# Patient Record
Sex: Female | Born: 1960 | Hispanic: No | Smoking: Current every day smoker
Health system: Southern US, Community
[De-identification: ages and names within clinical notes are randomized; demographics above are authoritative.]

## PROBLEM LIST (undated history)

## (undated) DIAGNOSIS — K219 Gastro-esophageal reflux disease without esophagitis: Secondary | ICD-10-CM

## (undated) HISTORY — DX: Gastro-esophageal reflux disease without esophagitis: K21.9

## (undated) HISTORY — PX: ADENOIDECTOMY: SUR15

## (undated) HISTORY — PX: TUBAL LIGATION: SHX77

## (undated) HISTORY — PX: TONSILLECTOMY: SUR1361

---

## 2004-12-10 ENCOUNTER — Emergency Department (HOSPITAL_COMMUNITY): Admission: EM | Admit: 2004-12-10 | Discharge: 2004-12-10 | Payer: Self-pay | Admitting: Emergency Medicine

## 2009-03-14 ENCOUNTER — Emergency Department (HOSPITAL_COMMUNITY): Admission: EM | Admit: 2009-03-14 | Discharge: 2009-03-14 | Payer: Self-pay | Admitting: Emergency Medicine

## 2010-08-16 ENCOUNTER — Encounter: Payer: Self-pay | Admitting: Family Medicine

## 2010-08-16 ENCOUNTER — Encounter: Payer: Self-pay | Admitting: Emergency Medicine

## 2010-08-22 ENCOUNTER — Emergency Department (HOSPITAL_COMMUNITY)
Admission: EM | Admit: 2010-08-22 | Discharge: 2010-08-22 | Payer: Self-pay | Source: Home / Self Care | Admitting: Emergency Medicine

## 2010-08-25 LAB — WOUND CULTURE

## 2018-02-10 ENCOUNTER — Other Ambulatory Visit: Payer: Self-pay | Admitting: Internal Medicine

## 2018-02-10 DIAGNOSIS — R1013 Epigastric pain: Secondary | ICD-10-CM

## 2018-02-16 ENCOUNTER — Ambulatory Visit (HOSPITAL_COMMUNITY)
Admission: RE | Admit: 2018-02-16 | Discharge: 2018-02-16 | Disposition: A | Discharge status: Home / Self Care | Payer: BLUE CROSS/BLUE SHIELD | Source: Ambulatory Visit | Attending: Internal Medicine | Admitting: Internal Medicine

## 2018-02-16 DIAGNOSIS — K8020 Calculus of gallbladder without cholecystitis without obstruction: Secondary | ICD-10-CM | POA: Insufficient documentation

## 2018-02-16 DIAGNOSIS — R1013 Epigastric pain: Secondary | ICD-10-CM | POA: Insufficient documentation

## 2018-02-16 DIAGNOSIS — K7689 Other specified diseases of liver: Secondary | ICD-10-CM | POA: Insufficient documentation

## 2018-02-23 ENCOUNTER — Ambulatory Visit (INDEPENDENT_AMBULATORY_CARE_PROVIDER_SITE_OTHER): Payer: BLUE CROSS/BLUE SHIELD | Admitting: General Surgery

## 2018-02-23 ENCOUNTER — Encounter: Payer: Self-pay | Admitting: General Surgery

## 2018-02-23 VITALS — BP 151/89 | HR 85 | Temp 98.0°F | Resp 20 | Wt 161.0 lb

## 2018-02-23 DIAGNOSIS — K8010 Calculus of gallbladder with chronic cholecystitis without obstruction: Secondary | ICD-10-CM

## 2018-02-23 DIAGNOSIS — K219 Gastro-esophageal reflux disease without esophagitis: Secondary | ICD-10-CM | POA: Diagnosis not present

## 2018-02-23 MED ORDER — SUCRALFATE 1 GM/10ML PO SUSP: 1.0000 g | Freq: Three times a day (TID) | ORAL | 0 refills | Status: DC

## 2018-02-23 MED ORDER — OMEPRAZOLE MAGNESIUM 20 MG PO TBEC: 40.0000 mg | DELAYED_RELEASE_TABLET | Freq: Two times a day (BID) | ORAL | 1 refills | Status: DC

## 2018-02-23 NOTE — Patient Instructions (Addendum)
Take omeprazole daily to twice daily.  Carafate Rx Four times a day to help with gastritis if needed.    Gastritis, Adult Gastritis is swelling (inflammation) of the stomach. When you have this condition, you can have these problems (symptoms):  Pain in your stomach.  A burning feeling in your stomach.  Feeling sick to your stomach (nauseous).  Throwing up (vomiting).  Feeling too full after you eat.  It is important to get help for this condition. Without help, your stomach can bleed, and you can get sores (ulcers) in your stomach. Follow these instructions at home:  Take over-the-counter and prescription medicines only as told by your doctor.  If you were prescribed an antibiotic medicine, take it as told by your doctor. Do not stop taking it even if you start to feel better.  Drink enough fluid to keep your pee (urine) clear or pale yellow.  Instead of eating big meals, eat small meals often. Contact a health care provider if:  Your problems get worse.  Your problems go away and then come back. Get help right away if:  You throw up blood or something that looks like coffee grounds.  You have black or dark red poop (stools).  You cannot keep fluids down.  Your stomach pain gets worse.  You have a fever.  You do not feel better after 1 week. This information is not intended to replace advice given to you by your health care provider. Make sure you discuss any questions you have with your health care provider. Document Released: 12/29/2007 Document Revised: 03/10/2016 Document Reviewed: 04/05/2015 Elsevier Interactive Patient Education  2018 [employer].   Cholelithiasis Cholelithiasis is also called "gallstones." It is a kind of gallbladder disease. The gallbladder is an organ that stores a liquid (bile) that helps you digest fat. Gallstones may not cause symptoms (may be silent gallstones) until they cause a blockage, and then they can cause pain (gallbladder  attack). Follow these instructions at home:  Take over-the-counter and prescription medicines only as told by your doctor.  Stay at a healthy weight.  Eat healthy foods. This includes: ? Eating fewer fatty foods, like fried foods. ? Eating fewer refined carbs (refined carbohydrates). Refined carbs are breads and grains that are highly processed, like white bread and white rice. Instead, choose whole grains like whole-wheat bread and brown rice. ? Eating more fiber. Almonds, fresh fruit, and beans are healthy sources of fiber.  Keep all follow-up visits as told by your doctor. This is important. Contact a doctor if:  You have sudden pain in the upper right side of your belly (abdomen). Pain might spread to your right shoulder or your chest. This may be a sign of a gallbladder attack.  You feel sick to your stomach (are nauseous).  You throw up (vomit).  You have been diagnosed with gallstones that have no symptoms and you get: ? Belly pain. ? Discomfort, burning, or fullness in the upper part of your belly (indigestion). Get help right away if:  You have sudden pain in the upper right side of your belly, and it lasts for more than 2 hours.  You have belly pain that lasts for more than 5 hours.  You have a fever or chills.  You keep feeling sick to your stomach or you keep throwing up.  Your skin or the whites of your eyes turn yellow (jaundice).  You have dark-colored pee (urine).  You have light-colored poop (stool). Summary  Cholelithiasis is also  called "gallstones."  The gallbladder is an organ that stores a liquid (bile) that helps you digest fat.  Silent gallstones are gallstones that do not cause symptoms.  A gallbladder attack may cause sudden pain in the upper right side of your belly. Pain might spread to your right shoulder or your chest. If this happens, contact your doctor.  If you have sudden pain in the upper right side of your belly that lasts for more  than 2 hours, get help right away. This information is not intended to replace advice given to you by your health care provider. Make sure you discuss any questions you have with your health care provider. Document Released: 12/29/2007 Document Revised: 03/28/2016 Document Reviewed: 03/28/2016 Elsevier Interactive Patient Education  2017 [employer]   Laparoscopic Cholecystectomy Laparoscopic cholecystectomy is surgery to remove the gallbladder. The gallbladder is a pear-shaped organ that lies beneath the liver on the right side of the body. The gallbladder stores bile, which is a fluid that helps the body to digest fats. Cholecystectomy is often done for inflammation of the gallbladder (cholecystitis). This condition is usually caused by a buildup of gallstones (cholelithiasis) in the gallbladder. Gallstones can block the flow of bile, which can result in inflammation and pain. In severe cases, emergency surgery may be required. This procedure is done though small incisions in your abdomen (laparoscopic surgery). A thin scope with a camera (laparoscope) is inserted through one incision. Thin surgical instruments are inserted through the other incisions. In some cases, a laparoscopic procedure may be turned into a type of surgery that is done through a larger incision (open surgery). Tell a health care provider about:  Any allergies you have.  All medicines you are taking, including vitamins, herbs, eye drops, creams, and over-the-counter medicines.  Any problems you or family members have had with anesthetic medicines.  Any blood disorders you have.  Any surgeries you have had.  Any medical conditions you have.  Whether you are pregnant or may be pregnant. What are the risks? Generally, this is a safe procedure. However, problems may occur, including:  Infection.  Bleeding.  Allergic reactions to medicines.  Damage to other structures or organs.  A stone remaining in the common  bile duct. The common bile duct carries bile from the gallbladder into the small intestine.  A bile leak from the cyst duct that is clipped when your gallbladder is removed.  Medicines  Ask your health care provider about: ? Changing or stopping your regular medicines. This is especially important if you are taking diabetes medicines or blood thinners. ? Taking medicines such as aspirin and ibuprofen. These medicines can thin your blood. Do not take these medicines before your procedure if your health care provider instructs you not to.  You may be given antibiotic medicine to help prevent infection. General instructions  Let your health care provider know if you develop a cold or an infection before surgery.  Plan to have someone take you home from the hospital or clinic.  Ask your health care provider how your surgical site will be marked or identified. What happens during the procedure?  To reduce your risk of infection: ? Your health care team will wash or sanitize their hands. ? Your skin will be washed with soap. ? Hair may be removed from the surgical area.  An IV tube may be inserted into one of your veins.  You will be given one or more of the following: ? A medicine to help  you relax (sedative). ? A medicine to make you fall asleep (general anesthetic).  A breathing tube will be placed in your mouth.  Your surgeon will make several small cuts (incisions) in your abdomen.  The laparoscope will be inserted through one of the small incisions. The camera on the laparoscope will send images to a TV screen (monitor) in the operating room. This lets your surgeon see inside your abdomen.  Air-like gas will be pumped into your abdomen. This will expand your abdomen to give the surgeon more room to perform the surgery.  Other tools that are needed for the procedure will be inserted through the other incisions. The gallbladder will be removed through one of the  incisions.  Your common bile duct may be examined. If stones are found in the common bile duct, they may be removed.  After your gallbladder has been removed, the incisions will be closed with stitches (sutures), staples, or skin glue.  Your incisions may be covered with a bandage (dressing). The procedure may vary among health care providers and hospitals. What happens after the procedure?  Your blood pressure, heart rate, breathing rate, and blood oxygen level will be monitored until the medicines you were given have worn off.  You will be given medicines as needed to control your pain.  Do not drive for 24 hours if you were given a sedative. This information is not intended to replace advice given to you by your health care provider. Make sure you discuss any questions you have with your health care provider. Document Released: 07/12/2005 Document Revised: 02/01/2016 Document Reviewed: 12/29/2015 Elsevier Interactive Patient Education  2018 [employer].

## 2018-02-23 NOTE — Progress Notes (Signed)
[facility] History and Physical  Reason for Referral: Abdominal pain? Gallbladder  Referring Physician: Dr. Margo Aye   Chief Complaint    Cholelithiasis      Alejandra Jensen is a 58 y.o. female.  HPI: Alejandra Jensen is a 57 yo who reports having epigastric pain for about 6 months now. She says that this pain can occur any time and does not have association with food. She says she can have pain with an empty stomach or after eating or not related to eating at all. She has had some nausea/bloating but no vomiting. She reports no RUQ pain at this time. She says that she has been prescribed Nexium in the past that helped some with the pain and then was on Omeprazole and that this helped the most. She has never been in BID omeprazole and has never had GI/ EGD evaluation or tried Carafate. She denies any excessive BC/ Aleve/ Ibuprofen use but has had to use Excedrin in the past for toothaches/ headaches. She has not been on the Excedrin for several weeks.    Past Medical History:  Diagnosis Date  . GERD (gastroesophageal reflux disease)     Past Surgical History:  Procedure Laterality Date  . ADENOIDECTOMY    . TONSILLECTOMY    . TUBAL LIGATION      Family History  Problem Relation Age of Onset  . Asthma Mother     Social History   Tobacco Use  . Smoking status: Current Every Day Smoker    Types: Cigarettes  . Smokeless tobacco: Never Used  Substance Use Topics  . Alcohol use: Not Currently  . Drug use: Never    Medications: I have reviewed the patient's current medications. Allergies as of 02/23/2018   No Known Allergies     Medication List        Accurate as of 02/23/18  4:00 PM. Always use your most recent med list.          omeprazole 20 MG tablet Commonly known as:  PRILOSEC OTC Take 2 tablets (40 mg total) by mouth 2 (two) times daily.   sucralfate 1 GM/10ML suspension Commonly known as:  CARAFATE Take 10 mLs (1 g total) by mouth 4 (four) times daily  -  with meals and at bedtime.        ROS:  A comprehensive review of systems was negative except for: Gastrointestinal: positive for abdominal pain and reflux symptoms  Blood pressure (!) 151/89, pulse 85, temperature 98 F (36.7 C), temperature source Temporal, resp. rate 20, weight 161 lb (73 kg). Physical Exam  Constitutional: She is oriented to person, place, and time. She appears well-developed and well-nourished.  HENT:  Head: Normocephalic.  Eyes: Pupils are equal, round, and reactive to light.  Neck: Normal range of motion.  Cardiovascular: Normal rate and regular rhythm.  Pulmonary/Chest: Effort normal and breath sounds normal.  Abdominal: Soft. She exhibits no distension. There is no tenderness.  Musculoskeletal: Normal range of motion. She exhibits no edema.  Neurological: She is alert and oriented to person, place, and time.  Skin: Skin is warm and dry.  Psychiatric: She has a normal mood and affect. Her behavior is normal. Judgment and thought content normal.  Vitals reviewed.   Results: [place] RUQ - Study Result   CLINICAL DATA:  Epigastric pain.  EXAM: ABDOMEN ULTRASOUND COMPLETE  COMPARISON:  No prior.  FINDINGS: Gallbladder: Gallstones and sludge noted. Gallbladder wall thickness 3.9 mm. Cholecystitis cannot be excluded. Negative Eulah Pont  sign.  Common bile duct: Diameter: 1.6 mm  Liver: Mild increased echogenicity consistent fatty infiltration and/or hepatocellular disease. 2.6 cm cyst with thin septations noted the left lobe of the liver. This is most likely benign. Portal vein is patent on color Doppler imaging with normal direction of blood flow towards the liver.  IVC: No abnormality visualized.  Pancreas: Visualized portion unremarkable.  Spleen: Size and appearance within normal limits.  Right Kidney: Length: 10.9 cm. Echogenicity within normal limits. No mass or hydronephrosis visualized.  Left Kidney: Length: 11.3 cm.  Echogenicity within normal limits. No mass or hydronephrosis visualized.  Abdominal aorta: No aneurysm visualized.  Other findings: None.  IMPRESSION: 1. Gallbladder stones and gallstones noted. Thickening of the gallbladder wall. Cholecystitis could present this fashion. No biliary distention. 2. Mild increased echogenicity liver consistent with fatty infiltration and/or hepatocellular disease. 2.6 cm benign-appearing cyst left lobe of the liver.    Assessment & Plan:  Alejandra Jensen is a 57 y.o. female with gallstones and chronic cholecystitis on her US but has complaints of epigastric pain that is made better when she takes her PPI. She says she had been off the omeprazole for several weeks and recently started this back with improvement in the pain. She reports that she really does not want surgery unless it is necessary, which is completely understandable and reasonable. Since her pain is improving with the PPI, I have discussed with her the possibility of gastritis/ ulcer disease.  -Recommend PPI be taken everyday and up to twice daily and have written Rx for carafate to see if this helps her pain improve  -If she is not continuing to improve in the next 2 weeks, she is going to call and let us know.  -We discussed that the symptoms could be from gastritis or the gallbladder or a combination of both and that she does have positive findings on her US that would be an indication for removing the gallbladder  -Discussed that if the PPI/ Carafate improve her symptoms, she likely needs referral to GI for EGD in the future  -Will send note to Dr. Margo AyeHall -Follow up PRN   Discussed laparoscopic cholecystectomy and the risk involved pending her deciding to pursue surgery.  I counseled the patient about the indication, risks and benefits of laparoscopic cholecystectomy.  She understands there is a very small chance for bleeding, infection, injury to normal structures (including common bile  duct), conversion to open surgery, persistent symptoms, evolution of postcholecystectomy diarrhea, need for secondary interventions, anesthesia reaction, cardiopulmonary issues and other risks not specifically detailed here. I described the expected recovery, the plan for follow-up and the restrictions during the recovery phase.   All questions were answered to the satisfaction of the patient.   Lucretia RoersLindsay C Even Budlong 02/23/2018, 4:00 PM

## 2019-03-07 ENCOUNTER — Ambulatory Visit
Admission: EM | Admit: 2019-03-07 | Discharge: 2019-03-07 | Disposition: A | Discharge status: Home / Self Care | Payer: BC Managed Care – PPO | Attending: Emergency Medicine | Admitting: Emergency Medicine

## 2019-03-07 ENCOUNTER — Other Ambulatory Visit: Payer: Self-pay

## 2019-03-07 DIAGNOSIS — G8929 Other chronic pain: Secondary | ICD-10-CM | POA: Insufficient documentation

## 2019-03-07 DIAGNOSIS — N3001 Acute cystitis with hematuria: Secondary | ICD-10-CM | POA: Diagnosis present

## 2019-03-07 DIAGNOSIS — M25571 Pain in right ankle and joints of right foot: Secondary | ICD-10-CM | POA: Diagnosis present

## 2019-03-07 DIAGNOSIS — L245 Irritant contact dermatitis due to other chemical products: Secondary | ICD-10-CM | POA: Diagnosis present

## 2019-03-07 DIAGNOSIS — M722 Plantar fascial fibromatosis: Secondary | ICD-10-CM | POA: Insufficient documentation

## 2019-03-07 DIAGNOSIS — R319 Hematuria, unspecified: Secondary | ICD-10-CM | POA: Insufficient documentation

## 2019-03-07 LAB — POCT URINALYSIS DIP (MANUAL ENTRY)
Bilirubin, UA: NEGATIVE
Glucose, UA: NEGATIVE mg/dL
Ketones, POC UA: NEGATIVE mg/dL
Nitrite, UA: NEGATIVE
Protein Ur, POC: NEGATIVE mg/dL
Spec Grav, UA: 1.015 (ref 1.010–1.025)
Urobilinogen, UA: 0.2 E.U./dL
pH, UA: 7 (ref 5.0–8.0)

## 2019-03-07 MED ORDER — NAPROXEN 375 MG PO TABS: 375.0000 mg | ORAL_TABLET | Freq: Two times a day (BID) | ORAL | 0 refills | Status: AC

## 2019-03-07 MED ORDER — NITROFURANTOIN MONOHYD MACRO 100 MG PO CAPS: 100.0000 mg | ORAL_CAPSULE | Freq: Two times a day (BID) | ORAL | 0 refills | Status: AC

## 2019-03-07 MED ORDER — TRIAMCINOLONE ACETONIDE 0.025 % EX CREA: 1.0000 "application " | TOPICAL_CREAM | Freq: Two times a day (BID) | CUTANEOUS | 0 refills | Status: AC

## 2019-03-07 NOTE — Discharge Instructions (Signed)
UTI: Urine culture sent.  We will call you with abnormal results.   Push fluids and get plenty of rest.   Take antibiotic as directed and to completion Follow up with PCP in 1-2 weeks to recheck urine Return here or go to ER if you have any new or worsening symptoms such as fever, worsening abdominal pain, nausea/vomiting, flank pain, etc...  Ankle pain/ plantar fascitis: Continue conservative management of rest, ice, elevation, and gentle stretches.  You may freeze a water bottle and roll under foot to assist with stretching Use an OTC ankle brace to help with support and to immobilize the joint Take naproxen as needed for pain relief (may cause abdominal discomfort, ulcers, and GI bleeds avoid taking with other NSAIDs) Follow up with orthopedist for further evaluation and management Present to ER if worsening or new symptoms (fever, chills, chest pain, abdominal pain, changes in bowel or bladder habits, pain radiating into lower legs, etc...)   Contact dermatitis: Wash with warm water and mild soap Avoid hot showers or baths Moisturize skin daily Triamcinolone cream prescribed.  Use as directed Use OTC zyrtec, claritin, or allegra during the day as needed for itching Follow up with PCP in 1-2 weeks for recheck Return or go to the ED if you have any new or worsen symptoms such as fever, chills, nausea, vomiting, redness, swelling, discharge, oral lesions, shortness of breath, chest pain, abdominal pain, changes in bowel or bladder function, etc..Marland Kitchen

## 2019-03-07 NOTE — ED Triage Notes (Signed)
Pt report urinary frequency and noticed with pink tinged urine and also right foot pain that has been chronic

## 2019-03-07 NOTE — ED Provider Notes (Addendum)
[facility]   CC: Multiple complaints  SUBJECTIVE:  Alejandra Jensen is a 58 y.o. female who complains of urinary frequency, urgency that began this morning, symptoms now resolved.  Admits to delayed bathroom breaks.  Denies abdominal or flank pain.  Has NOT tried OTC medication.  Symptoms are made worse with urination.  Admits to similar symptoms in the past related to UTI.  Does admit to seeing "pink" on the tissue with wiping.  Denies fever, chills, chest pain, SOB, nausea, vomiting, abdominal pain, flank pain, abnormal vaginal discharge or bleeding.    Patient also complaints of RT ankle/ foot pain x 6 months.  Denies a precipitating event or trauma.  Does work at [employer] and walks on concrete floors during her shifts. Localizes pain to outside of foot and heel.  Describes as worsening, intermittent and like "needles."  Has tried icing and heating with minimal relief.  Worse with walking.  Complains of possible swelling.  Denies associated erythema, ecchymosis, numbness or tingling.    Also mentions rash to right thumb x 2 months.  States she use to place stickers on her thumb, but has stopped.  Describes as intermittent and itching.  Has tried OTC creams without relief.  Denies wounds, or discharge.    Admits to smoking 1 PPD x 20-30 years  LMP: No LMP recorded. Patient is postmenopausal.  ROS: As in HPI.  All other pertinent ROS negative.     Past Medical History:  Diagnosis Date  . GERD (gastroesophageal reflux disease)    Past Surgical History:  Procedure Laterality Date  . ADENOIDECTOMY    . TONSILLECTOMY    . TUBAL LIGATION     No Known Allergies No current facility-administered medications on file prior to encounter.    Current Outpatient Medications on File Prior to Encounter  Medication Sig Dispense Refill  . [DISCONTINUED] omeprazole (PRILOSEC OTC) 20 MG tablet Take 2 tablets (40 mg total) by mouth 2 (two) times daily. 60 tablet 1  . [DISCONTINUED]  sucralfate (CARAFATE) 1 GM/10ML suspension Take 10 mLs (1 g total) by mouth 4 (four) times daily -  with meals and at bedtime. 420 mL 0   Social History   Socioeconomic History  . Marital status: Legally Separated    Spouse name: Not on file  . Number of children: Not on file  . Years of education: Not on file  . Highest education level: Not on file  Occupational History  . Not on file  Social Needs  . Financial resource strain: Not on file  . Food insecurity    Worry: Not on file    Inability: Not on file  . Transportation needs    Medical: Not on file    Non-medical: Not on file  Tobacco Use  . Smoking status: Current Every Day Smoker    Types: Cigarettes  . Smokeless tobacco: Never Used  Substance and Sexual Activity  . Alcohol use: Not Currently  . Drug use: Never  . Sexual activity: Not on file  Lifestyle  . Physical activity    Days per week: Not on file    Minutes per session: Not on file  . Stress: Not on file  Relationships  . Social Herbalist on phone: Not on file    Gets together: Not on file    Attends religious service: Not on file    Active member of club or organization: Not on file    Attends meetings of clubs  or organizations: Not on file    Relationship status: Not on file  . Intimate partner violence    Fear of current or ex partner: Not on file    Emotionally abused: Not on file    Physically abused: Not on file    Forced sexual activity: Not on file  Other Topics Concern  . Not on file  Social History Narrative  . Not on file   Family History  Problem Relation Age of Onset  . Asthma Mother     OBJECTIVE:  Vitals:   03/07/19 1456  BP: (!) 146/80  Pulse: 90  Resp: 18  Temp: 98.4 F (36.9 C)  SpO2: 96%   General appearance: AOx3 in no acute distress HEENT: NCAT.  PERRL Lungs: clear to auscultation bilaterally without adventitious breath sounds Heart: regular rate and rhythm.  Radial pulses 2+ symmetrical bilaterally  Abdomen: soft; non-distended; no tenderness; bowel sounds present; no guarding Back: no CVA tenderness Extremities: no edema; symmetrical with no gross deformities; RT ankle/ Foot: skin without erythema swelling or ecchymosis, TTP over lateral ankle and heel, 5/5 with dorsi/plantar flexion, dorsalis pedis pulse 2+, sensation intact Skin: warm and dry; thickened white scaly skin changes to posterior aspect of thumb, localized to proximal aspect of 1st phalanx, NTTP, no obvious drainage or bleeding Neurologic: Ambulates from chair to exam table without difficulty Psychological: alert and cooperative; normal mood and affect  Labs Reviewed  POCT URINALYSIS DIP (MANUAL ENTRY) - Abnormal; Notable for the following components:      Result Value   Blood, UA moderate (*)    Leukocytes, UA Trace (*)    All other components within normal limits  URINE CULTURE    ASSESSMENT & PLAN:  1. Hematuria, unspecified type   2. Acute cystitis with hematuria   3. Chronic pain of right ankle   4. Plantar fasciitis of right foot   5. Irritant contact dermatitis due to other chemical products     Meds ordered this encounter  Medications  . nitrofurantoin, macrocrystal-monohydrate, (MACROBID) 100 MG capsule    Sig: Take 1 capsule (100 mg total) by mouth 2 (two) times daily for 5 days.    Dispense:  10 capsule    Refill:  0    Order Specific Question:   Supervising Provider    Answer:   Eustace MooreNELSON, YVONNE SUE [[id number]]  . naproxen (NAPROSYN) 375 MG tablet    Sig: Take 1 tablet (375 mg total) by mouth 2 (two) times daily.    Dispense:  20 tablet    Refill:  0    Order Specific Question:   Supervising Provider    Answer:   Eustace MooreNELSON, YVONNE SUE [[id number]]  . triamcinolone (KENALOG) 0.025 % cream    Sig: Apply 1 application topically 2 (two) times daily.    Dispense:  30 g    Refill:  0    Order Specific Question:   Supervising Provider    Answer:   Eustace MooreELSON, YVONNE SUE [[id number]]   UTI: Urine culture sent.   We will call you with abnormal results.   Push fluids and get plenty of rest.   Take antibiotic as directed and to completion Follow up with PCP in 1-2 weeks to recheck urine Return here or go to ER if you have any new or worsening symptoms such as fever, worsening abdominal pain, nausea/vomiting, flank pain, etc...  Ankle pain/ plantar fascitis: Continue conservative management of rest, ice, elevation, and gentle stretches.  You may freeze a  water bottle and roll under foot to assist with stretching Use an OTC ankle brace to help with support and to immobilize the joint Take naproxen as needed for pain relief (may cause abdominal discomfort, ulcers, and GI bleeds avoid taking with other NSAIDs) Follow up with orthopedist for further evaluation and management Present to ER if worsening or new symptoms (fever, chills, chest pain, abdominal pain, changes in bowel or bladder habits, pain radiating into lower legs, etc...)   Contact dermatitis: Wash with warm water and mild soap Avoid hot showers or baths Moisturize skin daily Triamcinolone cream prescribed.  Use as directed Use OTC zyrtec, claritin, or allegra during the day as needed for itching Follow up with PCP in 1-2 weeks for recheck Return or go to the ED if you have any new or worsen symptoms such as fever, chills, nausea, vomiting, redness, swelling, discharge, oral lesions, shortness of breath, chest pain, abdominal pain, changes in bowel or bladder function, etc...   Outlined signs and symptoms indicating need for more acute intervention. Patient verbalized understanding. After Visit Summary given.     Rennis HardingWurst, Brexlee Heberlein, PA-C 03/07/19 1547    Alvino ChapelWurst, Pakala VillageBrittany, PA-C 03/07/19 1549

## 2019-03-08 ENCOUNTER — Telehealth: Payer: Self-pay | Admitting: Emergency Medicine

## 2019-03-08 LAB — URINE CULTURE

## 2019-03-08 MED ORDER — CEPHALEXIN 500 MG PO CAPS: 500.0000 mg | ORAL_CAPSULE | Freq: Two times a day (BID) | ORAL | 0 refills | Status: AC

## 2019-03-08 NOTE — Telephone Encounter (Signed)
Reports nausea and vomiting to macrobid, patient requests another antibiotic. Keflex sent

## 2019-06-11 IMAGING — US US ABDOMEN COMPLETE
1 series · 13 of 25 positions shown · non-contrast
Comparison: No prior.

CLINICAL DATA: Epigastric pain.

EXAM:
ABDOMEN ULTRASOUND COMPLETE

[Series 1: us abdomen complete · 0.15mm/px · 13 of 132 slices shown]
[im 1/132]
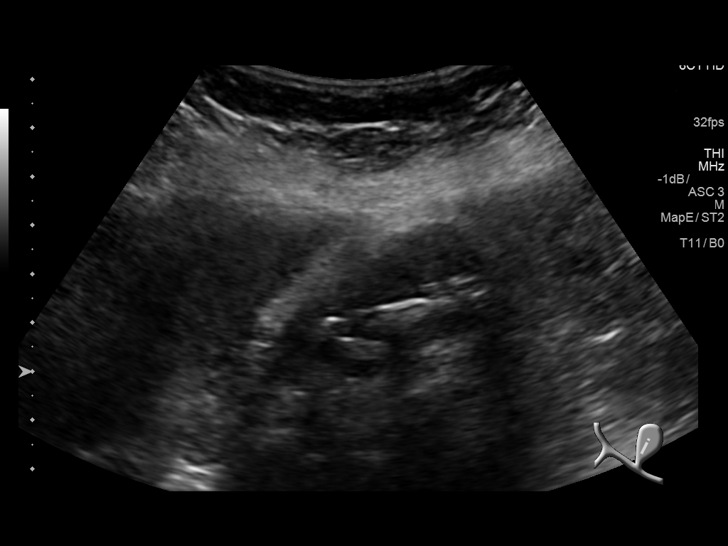
[im 11/132]
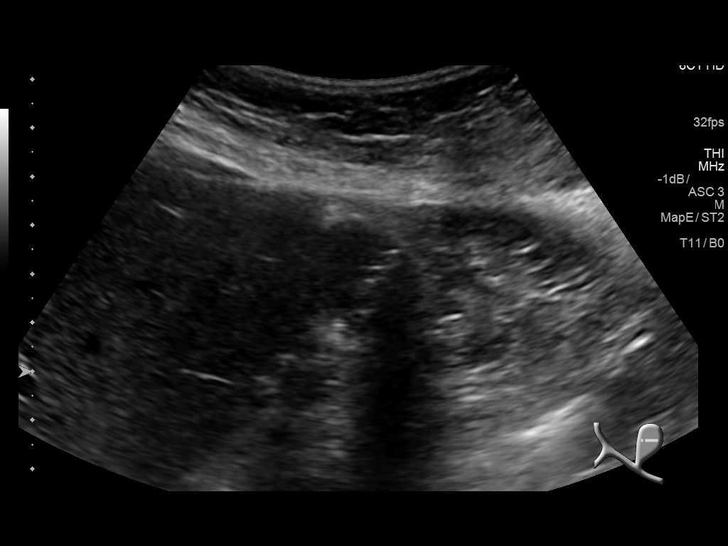
[im 22/132]
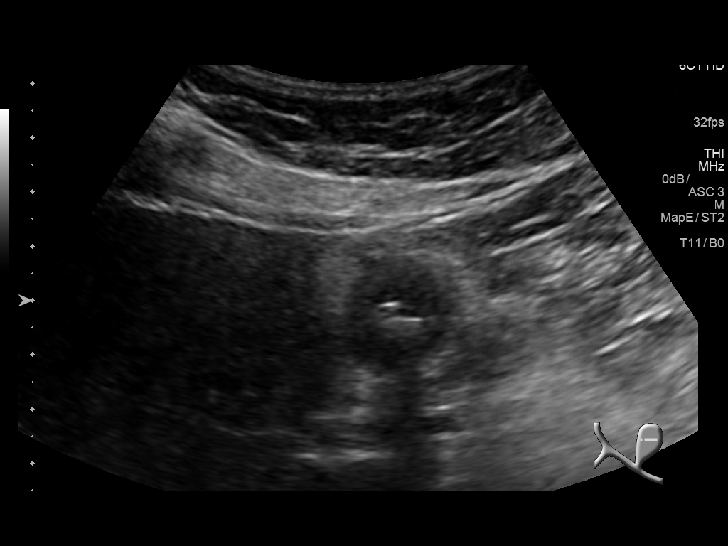
[im 33/132]
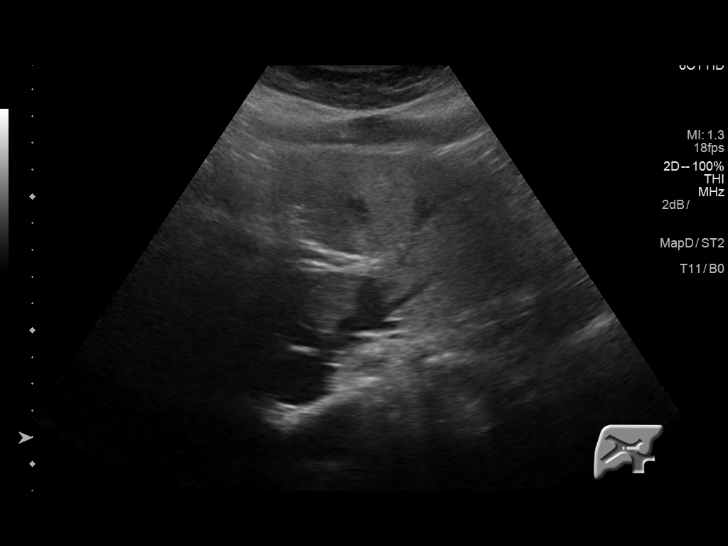
[im 44/132]
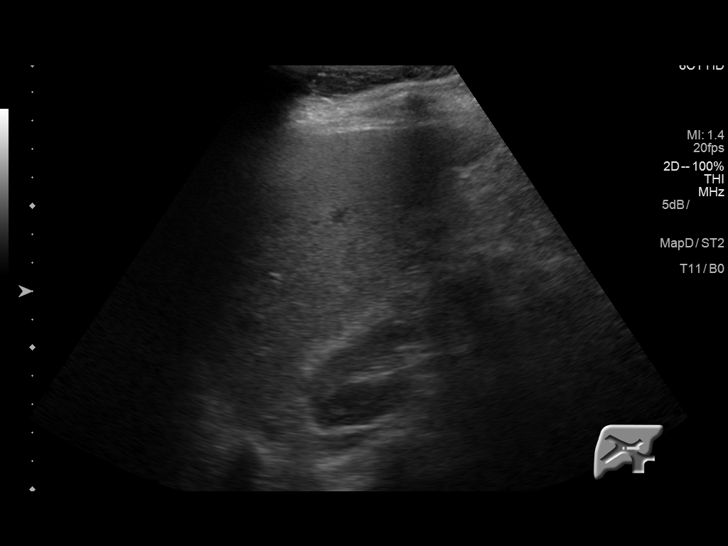
[im 55/132]
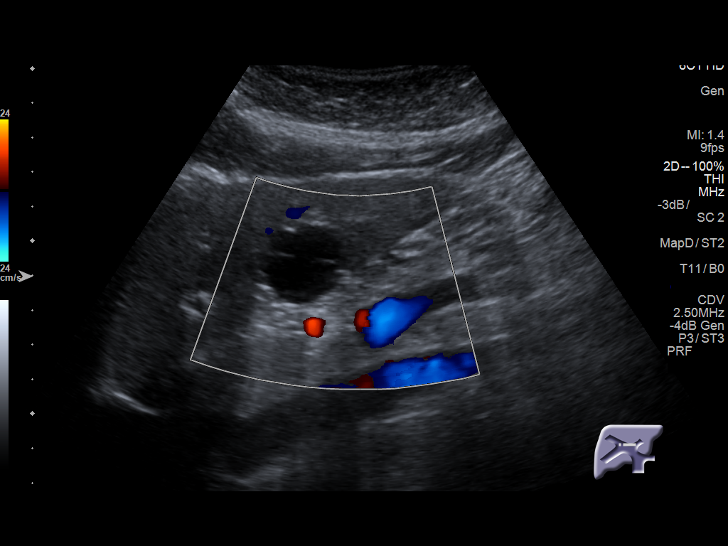
[im 66/132]
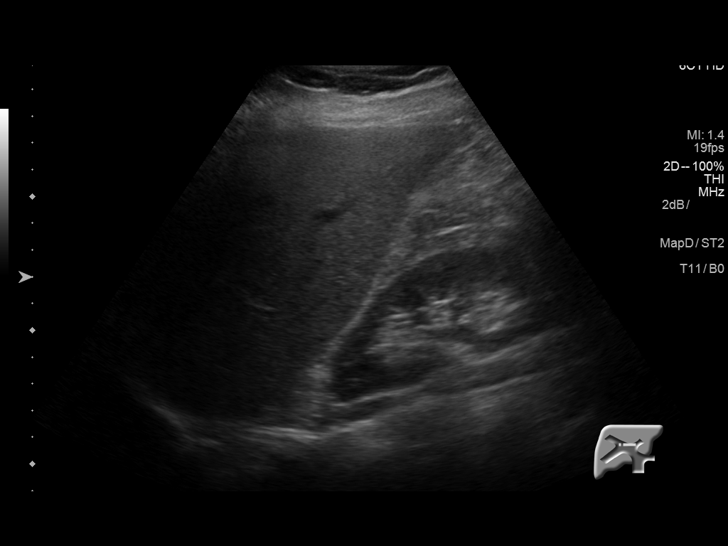
[im 77/132]
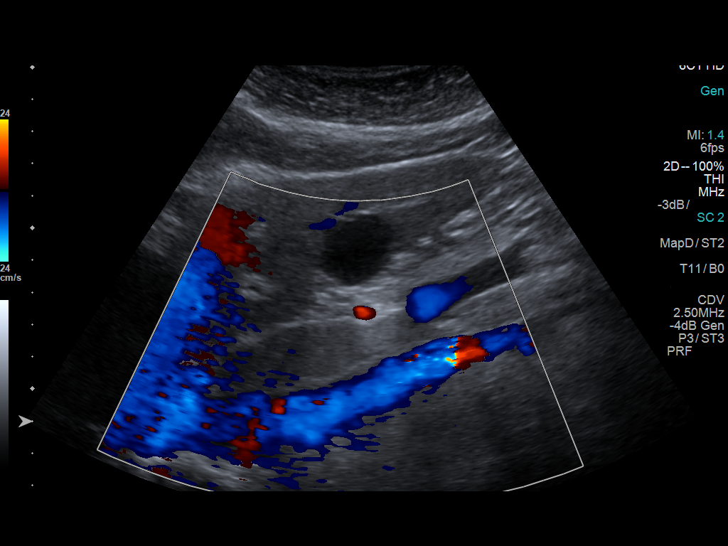
[im 88/132]
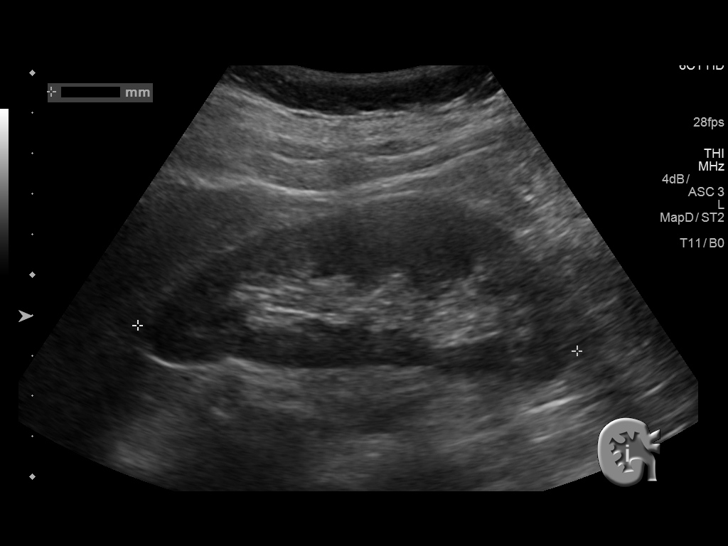
[im 99/132]
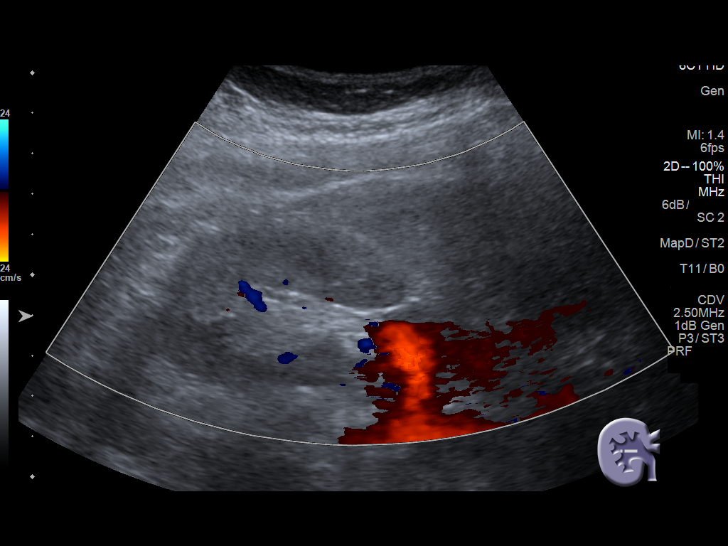
[im 110/132]
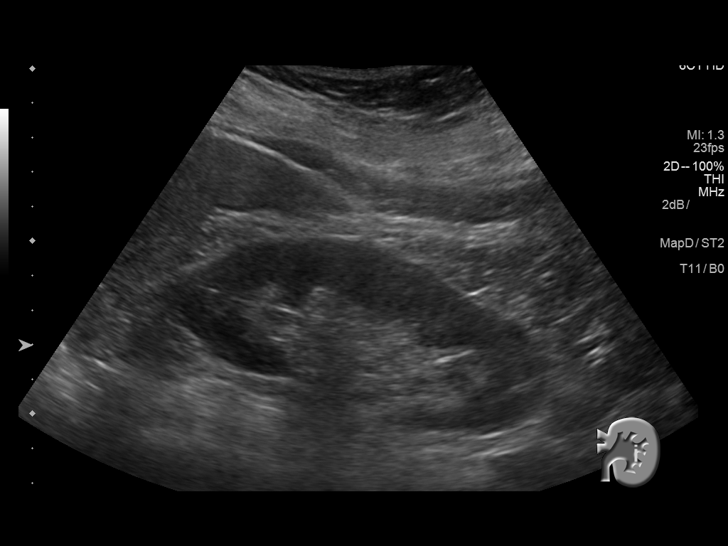
[im 121/132]
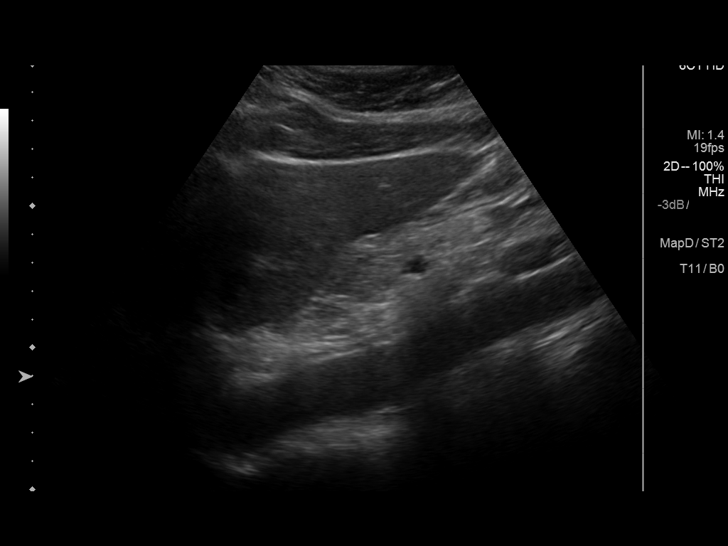
[im 132/132]
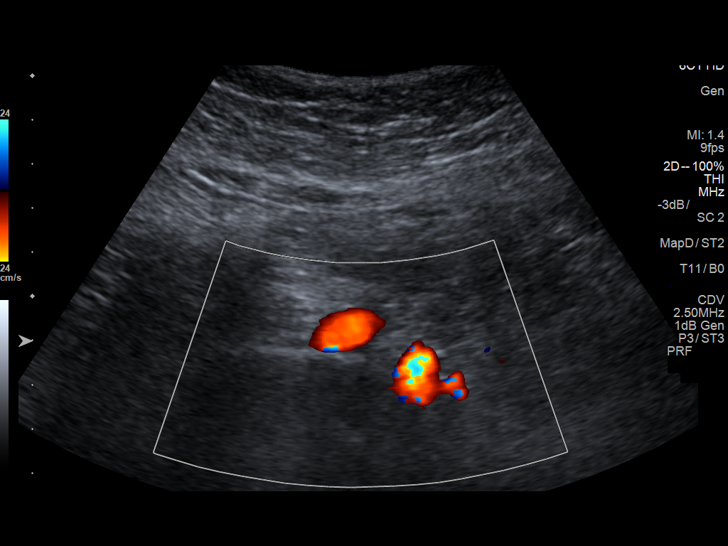

[13 of 25 positions shown; findings below may reference images not displayed]

FINDINGS: Gallbladder: Gallstones and sludge noted. Gallbladder wall thickness
3.9 mm. Cholecystitis cannot be excluded. Negative Murphy sign.

Common bile duct: Diameter: 1.6 mm

Liver: Mild increased echogenicity consistent fatty infiltration
and/or hepatocellular disease. 2.6 cm cyst with thin septations
noted the left lobe of the liver. This is most likely benign. Portal
vein is patent on color Doppler imaging with normal direction of
blood flow towards the liver.

IVC: No abnormality visualized.

Pancreas: Visualized portion unremarkable.

Spleen: Size and appearance within normal limits.

Right Kidney: Length: 10.9 cm. Echogenicity within normal limits. No
mass or hydronephrosis visualized.

Left Kidney: Length: 11.3 cm. Echogenicity within normal limits. No
mass or hydronephrosis visualized.

Abdominal aorta: No aneurysm visualized.

Other findings: None.
IMPRESSION: 1. Gallbladder stones and gallstones noted. Thickening of the
gallbladder wall. Cholecystitis could present this fashion. No
biliary distention. 2. Mild increased echogenicity liver consistent
with fatty infiltration and/or hepatocellular disease. 2.6 cm
benign-appearing cyst left lobe of the liver.

## 2019-08-24 ENCOUNTER — Telehealth: Payer: Self-pay | Admitting: *Deleted

## 2019-08-24 ENCOUNTER — Other Ambulatory Visit: Payer: Self-pay

## 2019-08-24 ENCOUNTER — Ambulatory Visit: Payer: BC Managed Care – PPO | Attending: Internal Medicine

## 2019-08-24 DIAGNOSIS — Z20822 Contact with and (suspected) exposure to covid-19: Secondary | ICD-10-CM

## 2019-08-24 NOTE — Telephone Encounter (Signed)
Patient called for assistance with her my chart account.

## 2019-08-25 LAB — NOVEL CORONAVIRUS, NAA: SARS-CoV-2, NAA: NOT DETECTED

## 2019-12-29 ENCOUNTER — Ambulatory Visit
Admission: EM | Admit: 2019-12-29 | Discharge: 2019-12-29 | Disposition: A | Discharge status: Home / Self Care | Payer: BC Managed Care – PPO | Attending: Emergency Medicine | Admitting: Emergency Medicine

## 2019-12-29 ENCOUNTER — Other Ambulatory Visit: Payer: Self-pay

## 2019-12-29 DIAGNOSIS — R300 Dysuria: Secondary | ICD-10-CM

## 2019-12-29 LAB — POCT URINALYSIS DIP (MANUAL ENTRY)
Bilirubin, UA: NEGATIVE
Glucose, UA: NEGATIVE mg/dL
Ketones, POC UA: NEGATIVE mg/dL
Nitrite, UA: NEGATIVE
Protein Ur, POC: NEGATIVE mg/dL
Spec Grav, UA: 1.025 (ref 1.010–1.025)
Urobilinogen, UA: 0.2 E.U./dL
pH, UA: 5.5 (ref 5.0–8.0)

## 2019-12-29 MED ORDER — PHENAZOPYRIDINE HCL 100 MG PO TABS: 100.0000 mg | ORAL_TABLET | Freq: Three times a day (TID) | ORAL | 0 refills | Status: AC | PRN

## 2019-12-29 NOTE — ED Triage Notes (Signed)
Pt began having burning with urination this am

## 2019-12-29 NOTE — Discharge Instructions (Addendum)
Urine culture sent.  We will call you with the results.   Push fluids and get plenty of rest.  n Take pyridium as prescribed and as needed for symptomatic relief Follow up with PCP if symptoms persists Return here or go to ER if you have any new or worsening symptoms such as fever, worsening abdominal pain, nausea/vomiting, flank pain, etc..Marland Kitchen

## 2019-12-29 NOTE — ED Provider Notes (Signed)
[facility]   CC: Burning with urination  SUBJECTIVE:  Alejandra Jensen is a 59 y.o. female who complains of urinary frequency,  and dysuria that started this morning.  Patient denies a precipitating event, recent sexual encounter, excessive caffeine intake.  Denies abdominal or flank pain. Has tried OTC medications without relief.  Symptoms are made worse with urination.  Admits to similar symptoms in the past.  Denies fever, chills, nausea, vomiting, abdominal pain, flank pain, abnormal vaginal discharge or bleeding, hematuria.    LMP: No LMP recorded. Patient is postmenopausal.  ROS: As in HPI.  All other pertinent ROS negative.     Past Medical History:  Diagnosis Date  . GERD (gastroesophageal reflux disease)    Past Surgical History:  Procedure Laterality Date  . ADENOIDECTOMY    . TONSILLECTOMY    . TUBAL LIGATION     No Known Allergies No current facility-administered medications on file prior to encounter.   Current Outpatient Medications on File Prior to Encounter  Medication Sig Dispense Refill  . naproxen (NAPROSYN) 375 MG tablet Take 1 tablet (375 mg total) by mouth 2 (two) times daily. 20 tablet 0  . triamcinolone (KENALOG) 0.025 % cream Apply 1 application topically 2 (two) times daily. 30 g 0  . [DISCONTINUED] omeprazole (PRILOSEC OTC) 20 MG tablet Take 2 tablets (40 mg total) by mouth 2 (two) times daily. 60 tablet 1  . [DISCONTINUED] sucralfate (CARAFATE) 1 GM/10ML suspension Take 10 mLs (1 g total) by mouth 4 (four) times daily -  with meals and at bedtime. 420 mL 0   Social History   Socioeconomic History  . Marital status: Legally Separated    Spouse name: Not on file  . Number of children: Not on file  . Years of education: Not on file  . Highest education level: Not on file  Occupational History  . Not on file  Tobacco Use  . Smoking status: Current Every Day Smoker    Types: Cigarettes  . Smokeless tobacco: Never Used  Substance and  Sexual Activity  . Alcohol use: Not Currently  . Drug use: Never  . Sexual activity: Not on file  Other Topics Concern  . Not on file  Social History Narrative  . Not on file   Social Determinants of Health   Financial Resource Strain:   . Difficulty of Paying Living Expenses:   Food Insecurity:   . Worried About Charity fundraiser in the Last Year:   . Arboriculturist in the Last Year:   Transportation Needs:   . Film/video editor (Medical):   Marland Kitchen Lack of Transportation (Non-Medical):   Physical Activity:   . Days of Exercise per Week:   . Minutes of Exercise per Session:   Stress:   . Feeling of Stress :   Social Connections:   . Frequency of Communication with Friends and Family:   . Frequency of Social Gatherings with Friends and Family:   . Attends Religious Services:   . Active Member of Clubs or Organizations:   . Attends Archivist Meetings:   Marland Kitchen Marital Status:   Intimate Partner Violence:   . Fear of Current or Ex-Partner:   . Emotionally Abused:   Marland Kitchen Physically Abused:   . Sexually Abused:    Family History  Problem Relation Age of Onset  . Asthma Mother     OBJECTIVE:  Vitals:   12/29/19 1426  BP: (!) 153/80  Pulse: 82  Temp: 98.6 F (37 C)  TempSrc: Oral  SpO2: 97%   General appearance: AOx3 in no acute distress HEENT: NCAT.  Oropharynx clear.  Lungs: clear to auscultation bilaterally without adventitious breath sounds Heart: regular rate and rhythm.  Radial pulses 2+ symmetrical bilaterally Abdomen: soft; non-distended; no tenderness; bowel sounds present; no guarding or rebound tenderness Back: no CVA tenderness Extremities: no edema; symmetrical with no gross deformities Skin: warm and dry Neurologic: Ambulates from chair to exam table without difficulty Psychological: alert and cooperative; normal mood and affect  Labs Reviewed  POCT URINALYSIS DIP (MANUAL ENTRY) - Abnormal; Notable for the following components:       Result Value   Blood, UA small (*)    Leukocytes, UA Trace (*)    All other components within normal limits  URINE CULTURE    ASSESSMENT & PLAN:  1. Dysuria   Urine will be prescribed for symptomatic relief until culture result become available.  POCT urinalysis shows small amount of blood with trace of leukocyte which is inconclusive for UTI.  Meds ordered this encounter  Medications  . phenazopyridine (PYRIDIUM) 100 MG tablet    Sig: Take 1 tablet (100 mg total) by mouth 3 (three) times daily as needed for pain.    Dispense:  15 tablet    Refill:  0   Discharge instructions Urine culture sent.  We will call you with the results.   Push fluids and get plenty of rest.  n Take pyridium as prescribed and as needed for symptomatic relief Follow up with PCP if symptoms persists Return here or go to ER if you have any new or worsening symptoms such as fever, worsening abdominal pain, nausea/vomiting, flank pain, etc...  Outlined signs and symptoms indicating need for more acute intervention. Patient verbalized understanding. After Visit Summary given.     Durward Parcel, FNP 12/29/19 1505

## 2019-12-31 LAB — URINE CULTURE: Culture: <10000 — AB

## 2020-03-31 ENCOUNTER — Ambulatory Visit
Admission: EM | Admit: 2020-03-31 | Discharge: 2020-03-31 | Disposition: A | Discharge status: Home / Self Care | Payer: BC Managed Care – PPO | Attending: Emergency Medicine | Admitting: Emergency Medicine

## 2020-03-31 DIAGNOSIS — M5441 Lumbago with sciatica, right side: Secondary | ICD-10-CM

## 2020-03-31 MED ORDER — DEXAMETHASONE SODIUM PHOSPHATE 10 MG/ML IJ SOLN
10.0000 mg | Freq: Once | INTRAMUSCULAR | Status: AC
  Administered 2020-03-31: 10 mg via INTRAMUSCULAR

## 2020-03-31 MED ORDER — PREDNISONE 10 MG (21) PO TBPK: ORAL_TABLET | ORAL | 0 refills | Status: AC

## 2020-03-31 MED ORDER — ACETAMINOPHEN 500 MG PO TABS: 500.0000 mg | ORAL_TABLET | Freq: Four times a day (QID) | ORAL | 0 refills | Status: AC | PRN

## 2020-03-31 MED ORDER — CYCLOBENZAPRINE HCL 5 MG PO TABS: 5.0000 mg | ORAL_TABLET | Freq: Three times a day (TID) | ORAL | 0 refills | Status: AC | PRN

## 2020-03-31 NOTE — ED Provider Notes (Signed)
[facility]   [id number] 03/31/20 Arrival Time: 1030   Chief Complaint  Patient presents with  . Back Pain     SUBJECTIVE: History from: patient.  Alejandra Jensen is a 59 y.o. female who presented to the urgent care with a complaint of right lower back pain that radiates to the right leg for the past few days..  Denies any precipitating event.  She localizes the pain to the right low back and right leg.  She describes the pain as constant and achy.  She has tried OTC medications without relief.  Her symptoms are made worse with ROM.  She denies similar symptoms in the past.     ROS: As per HPI.  All other pertinent ROS negative.      Past Medical History:  Diagnosis Date  . GERD (gastroesophageal reflux disease)    Past Surgical History:  Procedure Laterality Date  . ADENOIDECTOMY    . TONSILLECTOMY    . TUBAL LIGATION     No Known Allergies No current facility-administered medications on file prior to encounter.   Current Outpatient Medications on File Prior to Encounter  Medication Sig Dispense Refill  . naproxen (NAPROSYN) 375 MG tablet Take 1 tablet (375 mg total) by mouth 2 (two) times daily. 20 tablet 0  . phenazopyridine (PYRIDIUM) 100 MG tablet Take 1 tablet (100 mg total) by mouth 3 (three) times daily as needed for pain. 15 tablet 0  . triamcinolone (KENALOG) 0.025 % cream Apply 1 application topically 2 (two) times daily. 30 g 0  . [DISCONTINUED] omeprazole (PRILOSEC OTC) 20 MG tablet Take 2 tablets (40 mg total) by mouth 2 (two) times daily. 60 tablet 1  . [DISCONTINUED] sucralfate (CARAFATE) 1 GM/10ML suspension Take 10 mLs (1 g total) by mouth 4 (four) times daily -  with meals and at bedtime. 420 mL 0   Social History   Socioeconomic History  . Marital status: Legally Separated    Spouse name: Not on file  . Number of children: Not on file  . Years of education: Not on file  . Highest education level: Not on file  Occupational History  . Not  on file  Tobacco Use  . Smoking status: Current Every Day Smoker    Types: Cigarettes  . Smokeless tobacco: Never Used  Substance and Sexual Activity  . Alcohol use: Not Currently  . Drug use: Never  . Sexual activity: Not on file  Other Topics Concern  . Not on file  Social History Narrative  . Not on file   Social Determinants of Health   Financial Resource Strain:   . Difficulty of Paying Living Expenses: Not on file  Food Insecurity:   . Worried About Programme researcher, broadcasting/film/video in the Last Year: Not on file  . Ran Out of Food in the Last Year: Not on file  Transportation Needs:   . Lack of Transportation (Medical): Not on file  . Lack of Transportation (Non-Medical): Not on file  Physical Activity:   . Days of Exercise per Week: Not on file  . Minutes of Exercise per Session: Not on file  Stress:   . Feeling of Stress : Not on file  Social Connections:   . Frequency of Communication with Friends and Family: Not on file  . Frequency of Social Gatherings with Friends and Family: Not on file  . Attends Religious Services: Not on file  . Active Member of Clubs or Organizations: Not on file  .  Attends Banker Meetings: Not on file  . Marital Status: Not on file  Intimate Partner Violence:   . Fear of Current or Ex-Partner: Not on file  . Emotionally Abused: Not on file  . Physically Abused: Not on file  . Sexually Abused: Not on file   Family History  Problem Relation Age of Onset  . Asthma Mother     OBJECTIVE:  Vitals:   03/31/20 1113  BP: (!) 152/82  Pulse: 72  Resp: 20  Temp: 98 F (36.7 C)  SpO2: 98%     Physical Exam Vitals and nursing note reviewed.  Constitutional:      General: She is not in acute distress.    Appearance: Normal appearance. She is normal weight. She is not ill-appearing, toxic-appearing or diaphoretic.  HENT:     Head: Normocephalic.  Cardiovascular:     Rate and Rhythm: Normal rate and regular rhythm.     Pulses:  Normal pulses.     Heart sounds: Normal heart sounds. No murmur heard.  No friction rub. No gallop.   Pulmonary:     Effort: Pulmonary effort is normal. No respiratory distress.     Breath sounds: Normal breath sounds. No stridor. No wheezing, rhonchi or rales.  Chest:     Chest wall: No tenderness.  Musculoskeletal:        General: Tenderness present.     Lumbar back: Spasms and tenderness present.     Comments: Back:  Patient ambulates from chair to exam table without difficulty.  Inspection: Skin clear and intact without obvious swelling, erythema, or ecchymosis. Warm to the touch  Palpation: Vertebral processes nontender. Tenderness about the lower right paravertebral muscles  ROM: FROM Strength: 5/5 hip flexion, 5/5 knee extension, 5/5 knee flexion, 5/5 plantar flexion, 5/5 dorsiflexion  DTR: Patellar tendon reflex intact  Special Tests: positive Straight leg raise  Neurological:     Mental Status: She is alert and oriented to person, place, and time.     LABS:  No results found for this or any previous visit (from the past 24 hour(s)).   ASSESSMENT & PLAN:  1. Acute right-sided low back pain with right-sided sciatica     Meds ordered this encounter  Medications  . dexamethasone (DECADRON) injection 10 mg  . predniSONE (STERAPRED UNI-PAK 21 TAB) 10 MG (21) TBPK tablet    Sig: Take 6 tabs by mouth daily  for 1days, then 5 tabs for 1 days, then 4 tabs for 1 days, then 3 tabs for 1 days, 2 tabs for 1 days, then 1 tab by mouth daily for 1 days    Dispense:  21 tablet    Refill:  0  . cyclobenzaprine (FLEXERIL) 5 MG tablet    Sig: Take 1 tablet (5 mg total) by mouth 3 (three) times daily as needed.    Dispense:  30 tablet    Refill:  0  . acetaminophen (TYLENOL) 500 MG tablet    Sig: Take 1 tablet (500 mg total) by mouth every 6 (six) hours as needed.    Dispense:  30 tablet    Refill:  0    Discharge Instructions  Rest, ice and heat as needed Ensure adequate ROM  as tolerated. Prescribed Tylenol and prednisone for pain and inflammation Prescribed flexeril  for muscle spasm.  Do not drive or operate heavy machinery while taking this medication Return here or go to ER if you have any new or worsening symptoms such as numbness/tingling  of the inner thighs, loss of bladder or bowel control, headache/blurry vision, nausea/vomiting, confusion/altered mental status, dizziness, weakness, passing out, imbalance, etc...     Reviewed expectations re: course of current medical issues. Questions answered. Outlined signs and symptoms indicating need for more acute intervention. Patient verbalized understanding. After Visit Summary given.      Note: This document was prepared using Dragon voice recognition software and may include unintentional dictation errors.    Durward Parcel, FNP 03/31/20 1153

## 2020-03-31 NOTE — Discharge Instructions (Addendum)
Rest, ice and heat as needed Ensure adequate ROM as tolerated. Prescribed Tylenol and prednisone for pain and inflammation Prescribed flexeril  for muscle spasm.  Do not drive or operate heavy machinery while taking this medication Return here or go to ER if you have any new or worsening symptoms such as numbness/tingling of the inner thighs, loss of bladder or bowel control, headache/blurry vision, nausea/vomiting, confusion/altered mental status, dizziness, weakness, passing out, imbalance, etc..

## 2020-03-31 NOTE — ED Triage Notes (Signed)
Pt presents with lower back pain for past couple days, pt  Moved in a way while working that pulled back

## 2021-02-13 ENCOUNTER — Other Ambulatory Visit: Payer: Self-pay

## 2021-02-13 ENCOUNTER — Emergency Department (HOSPITAL_COMMUNITY)
Admission: EM | Admit: 2021-02-13 | Discharge: 2021-02-13 | Disposition: A | Discharge status: Home / Self Care | Payer: BC Managed Care – PPO | Attending: Emergency Medicine | Admitting: Emergency Medicine

## 2021-02-13 ENCOUNTER — Emergency Department (HOSPITAL_COMMUNITY): Payer: BC Managed Care – PPO

## 2021-02-13 DIAGNOSIS — F17210 Nicotine dependence, cigarettes, uncomplicated: Secondary | ICD-10-CM | POA: Diagnosis not present

## 2021-02-13 DIAGNOSIS — R079 Chest pain, unspecified: Secondary | ICD-10-CM

## 2021-02-13 DIAGNOSIS — U071 COVID-19: Secondary | ICD-10-CM | POA: Insufficient documentation

## 2021-02-13 LAB — COMPREHENSIVE METABOLIC PANEL
ALT: 25 U/L (ref 0–44)
AST: 20 U/L (ref 15–41)
Albumin: 3.6 g/dL (ref 3.5–5.0)
Alkaline Phosphatase: 58 U/L (ref 38–126)
Anion gap: 9 (ref 5–15)
BUN: 11 mg/dL (ref 8–23)
CO2: 21 mmol/L — ABNORMAL LOW (ref 22–32)
Calcium: 9 mg/dL (ref 8.9–10.3)
Chloride: 107 mmol/L (ref 98–111)
Creatinine, Ser: 0.64 mg/dL (ref 0.44–1.00)
GFR, Estimated: >60 mL/min (ref >60)
Glucose, Bld: 103 mg/dL — ABNORMAL HIGH (ref 70–99)
Potassium: 3.3 mmol/L — ABNORMAL LOW (ref 3.5–5.1)
Sodium: 137 mmol/L (ref 135–145)
Total Bilirubin: 0.3 mg/dL (ref 0.3–1.2)
Total Protein: 7.1 g/dL (ref 6.5–8.1)

## 2021-02-13 LAB — CBC
HCT: 39.1 % (ref 36.0–46.0)
Hemoglobin: 13.5 g/dL (ref 12.0–15.0)
MCH: 30.1 pg (ref 26.0–34.0)
MCHC: 34.5 g/dL (ref 30.0–36.0)
MCV: 87.1 fL (ref 80.0–100.0)
Platelets: 322 10*3/uL (ref 150–400)
RBC: 4.49 MIL/uL (ref 3.87–5.11)
RDW: 13.2 % (ref 11.5–15.5)
WBC: 8.5 10*3/uL (ref 4.0–10.5)
nRBC: 0 % (ref 0.0–0.2)

## 2021-02-13 LAB — TROPONIN I (HIGH SENSITIVITY)
Troponin I (High Sensitivity): 4 ng/L (ref <18)
Troponin I (High Sensitivity): 4 ng/L (ref <18)

## 2021-02-13 MED ORDER — ALUM & MAG HYDROXIDE-SIMETH 200-200-20 MG/5ML PO SUSP
30.0000 mL | Freq: Once | ORAL | Status: AC
  Administered 2021-02-13: 30 mL via ORAL
  Filled 2021-02-13: qty 30

## 2021-02-13 MED ORDER — NIRMATRELVIR/RITONAVIR (PAXLOVID)TABLET
3.0000 | ORAL_TABLET | Freq: Two times a day (BID) | ORAL | Status: DC
  Administered 2021-02-13: 3 via ORAL
  Filled 2021-02-13: qty 30

## 2021-02-13 NOTE — ED Triage Notes (Signed)
Pt c/o chest pain that began last night. Feels like a "ripping". Covid positive 7/11 or 7/12. Hx acid reflux

## 2021-02-13 NOTE — Discharge Instructions (Addendum)
Get plenty of rest and drink a lot of fluids.  Always use a mask when around other people for at least 5 days, longer if needed for ongoing symptoms.  Follow-up with your primary care doctor as needed for problems.

## 2021-02-13 NOTE — ED Provider Notes (Signed)
[facility]-EMERGENCY DEPT [facility] Emergency Department Provider Note MRN:  [id number]  Arrival date & time: 02/13/21     Chief Complaint   Chest Pain   History of Present Illness   Alejandra Jensen is a 60 y.o. year-old female with a history of GERD presenting to the ED with chief complaint of chest pain.  Patient has been feeling generally unwell over the past few days.  Had a positive COVID test at home 2 days ago has been having some pain in the chest described as sharp, worse when eating meals.  Endorsing shortness of breath but only when she has to wear the mask.  Denies fever, no abdominal pain, no leg pain or swelling, no history of blood clots, no history of heart disease.  Smokes cigarettes.  Symptoms are constant since last night, mild, no exacerbating or alleviating factors.  Review of Systems  A complete 10 system review of systems was obtained and all systems are negative except as noted in the HPI and PMH.   Patient's Health History    Past Medical History:  Diagnosis Date   GERD (gastroesophageal reflux disease)     Past Surgical History:  Procedure Laterality Date   ADENOIDECTOMY     TONSILLECTOMY     TUBAL LIGATION      Family History  Problem Relation Age of Onset   Asthma Mother     Social History   Socioeconomic History   Marital status: Legally Separated    Spouse name: Not on file   Number of children: Not on file   Years of education: Not on file   Highest education level: Not on file  Occupational History   Not on file  Tobacco Use   Smoking status: Every Day    Types: Cigarettes   Smokeless tobacco: Never  Substance and Sexual Activity   Alcohol use: Not Currently   Drug use: Never   Sexual activity: Not on file  Other Topics Concern   Not on file  Social History Narrative   Not on file   Social Determinants of Health   Financial Resource Strain: Not on file  Food Insecurity: Not on file  Transportation Needs: Not on file   Physical Activity: Not on file  Stress: Not on file  Social Connections: Not on file  Intimate Partner Violence: Not on file     Physical Exam   Vitals:   02/13/21 0616  BP: 138/72  Pulse: 86  Resp: 18  Temp: 98.5 F (36.9 C)  SpO2: 100%    CONSTITUTIONAL: Well-appearing, NAD NEURO:  Alert and oriented x 3, no focal deficits EYES:  eyes equal and reactive ENT/NECK:  no LAD, no JVD CARDIO: Regular rate, well-perfused, normal S1 and S2 PULM:  CTAB no wheezing or rhonchi GI/GU:  normal bowel sounds, non-distended, non-tender MSK/SPINE:  No gross deformities, no edema SKIN:  no rash, atraumatic PSYCH:  Appropriate speech and behavior  *Additional and/or pertinent findings included in MDM below  Diagnostic and Interventional Summary    EKG Interpretation  Date/Time:  Friday February 13 2021 06:20:04 EDT Ventricular Rate:  84 PR Interval:  197 QRS Duration: 128 QT Interval:  431 QTC Calculation: 510 R Axis:   80 Text Interpretation: Sinus or ectopic atrial rhythm Ventricular premature complex Nonspecific intraventricular conduction delay Repol abnrm suggests ischemia, inferior leads Baseline wander in lead(s) V4 V5 Confirmed by Kennis Carina [phone]) on 02/13/2021 6:22:38 AM       Labs Reviewed  CBC  COMPREHENSIVE METABOLIC PANEL  TROPONIN I (HIGH SENSITIVITY)    DG Chest Port 1 View    (Results Pending)    Medications  alum & mag hydroxide-simeth (MAALOX/MYLANTA) 200-200-20 MG/5ML suspension 30 mL (has no administration in time range)     Procedures  /  Critical Care Procedures  ED Course and Medical Decision Making  I have reviewed the triage vital signs, the nursing notes, and pertinent available records from the EMR.  Listed above are laboratory and imaging tests that I personally ordered, reviewed, and interpreted and then considered in my medical decision making (see below for details).  Suspect GI etiology of patient's chest pain or symptoms related to  COVID-19.  Has tested positive at home.  Vital signs are normal, sitting in no acute distress with clear lungs, abdomen soft and nontender.  EKG demonstrating some repolarization abnormalities with no prior for comparison, will need troponin x2.  No hypoxia, no tachycardia, no leg pain or swelling, highly doubt PE.  Signed out to oncoming provider at shift change.       Elmer Sow. Pilar Plate, MD [facility] Emergency Medicine [facility] [email or web address]  Final Clinical Impressions(s) / ED Diagnoses     ICD-10-CM   1. Chest pain, unspecified type  R07.9     2. COVID-19  U07.1       ED Discharge Orders     None        Discharge Instructions Discussed with and Provided to Patient:   Discharge Instructions   None       Sabas Sous, MD 02/13/21 [phone]

## 2021-02-13 NOTE — ED Provider Notes (Signed)
7:05 PM-checkout from Dr. Pilar Plate to evaluate patient after return of complete testing results.  She presents for evaluation of general etiologies, with positive COVID test and chest discomfort.  Chest x-ray normal.  EKG indicates possible inferior ischemia.  No acute ST elevation.  10:00 AM-at this time she states she "feels better."  She is agreeable to starting Paxlovid.  Review of her current medications indicate no complications with initiation of Paxlovid therapy.  Discussed isolation and treatment paradigms with the patient.  She is stable for discharge with outpatient management.  Paxlovid started here and full course dispensed by our pharmacist.     Mancel Bale, MD 02/13/21 1001

## 2021-08-12 DIAGNOSIS — E782 Mixed hyperlipidemia: Secondary | ICD-10-CM | POA: Diagnosis not present

## 2021-08-19 DIAGNOSIS — Z0001 Encounter for general adult medical examination with abnormal findings: Secondary | ICD-10-CM | POA: Diagnosis not present

## 2021-12-27 IMAGING — DX DG CHEST 1V PORT
1 series · 1 of 1 positions shown · non-contrast
Comparison: None.

CLINICAL DATA: Chest pain

EXAM:
PORTABLE CHEST 1 VIEW

[chest ap]
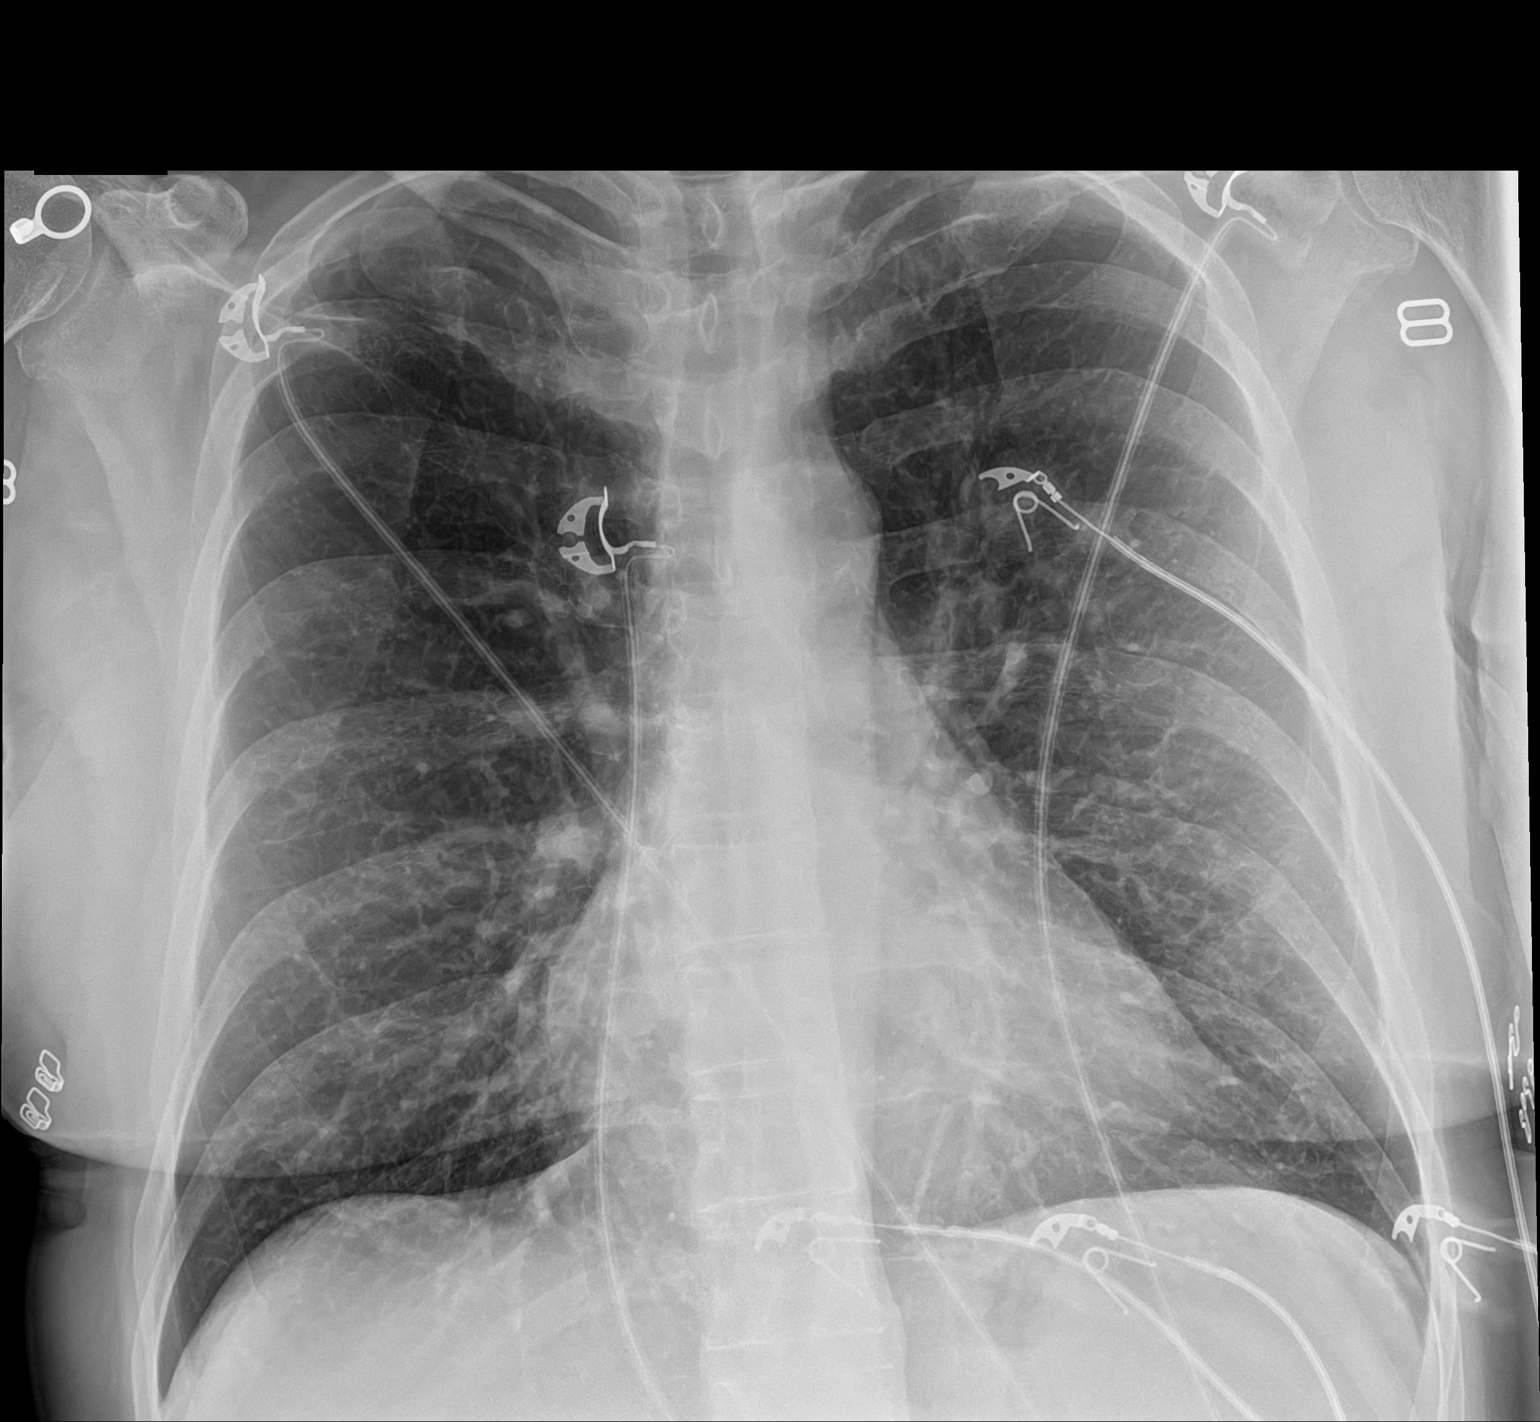

[1 of 1 positions shown; findings below may reference images not displayed]

FINDINGS: Normal heart size and mediastinal contours. No acute infiltrate or
edema. No effusion or pneumothorax. No acute osseous findings.
IMPRESSION: No active disease.

## 2022-01-14 DIAGNOSIS — L299 Pruritus, unspecified: Secondary | ICD-10-CM | POA: Diagnosis not present

## 2022-01-27 ENCOUNTER — Other Ambulatory Visit (HOSPITAL_COMMUNITY): Payer: Self-pay | Admitting: Nurse Practitioner

## 2022-01-27 DIAGNOSIS — N898 Other specified noninflammatory disorders of vagina: Secondary | ICD-10-CM | POA: Diagnosis not present

## 2022-01-27 DIAGNOSIS — Z1231 Encounter for screening mammogram for malignant neoplasm of breast: Secondary | ICD-10-CM

## 2022-01-27 DIAGNOSIS — J302 Other seasonal allergic rhinitis: Secondary | ICD-10-CM | POA: Diagnosis not present

## 2022-01-27 DIAGNOSIS — Z01419 Encounter for gynecological examination (general) (routine) without abnormal findings: Secondary | ICD-10-CM | POA: Diagnosis not present

## 2022-02-08 ENCOUNTER — Ambulatory Visit (HOSPITAL_COMMUNITY)
Admission: RE | Admit: 2022-02-08 | Discharge: 2022-02-08 | Disposition: A | Discharge status: Home / Self Care | Payer: BC Managed Care – PPO | Source: Ambulatory Visit | Attending: Nurse Practitioner | Admitting: Nurse Practitioner

## 2022-02-08 DIAGNOSIS — Z1231 Encounter for screening mammogram for malignant neoplasm of breast: Secondary | ICD-10-CM | POA: Diagnosis not present

## 2022-02-11 ENCOUNTER — Other Ambulatory Visit (HOSPITAL_COMMUNITY): Payer: Self-pay | Admitting: Nurse Practitioner

## 2022-02-11 DIAGNOSIS — R928 Other abnormal and inconclusive findings on diagnostic imaging of breast: Secondary | ICD-10-CM

## 2022-02-22 DIAGNOSIS — N841 Polyp of cervix uteri: Secondary | ICD-10-CM | POA: Diagnosis not present

## 2022-02-22 DIAGNOSIS — Z6820 Body mass index (BMI) 20.0-20.9, adult: Secondary | ICD-10-CM | POA: Diagnosis not present

## 2022-03-02 ENCOUNTER — Ambulatory Visit (HOSPITAL_COMMUNITY)
Admission: RE | Admit: 2022-03-02 | Discharge: 2022-03-02 | Disposition: A | Discharge status: Home / Self Care | Payer: BC Managed Care – PPO | Source: Ambulatory Visit | Attending: Nurse Practitioner | Admitting: Nurse Practitioner

## 2022-03-02 ENCOUNTER — Other Ambulatory Visit (HOSPITAL_COMMUNITY): Payer: Self-pay | Admitting: Nurse Practitioner

## 2022-03-02 DIAGNOSIS — R928 Other abnormal and inconclusive findings on diagnostic imaging of breast: Secondary | ICD-10-CM | POA: Insufficient documentation

## 2022-03-02 DIAGNOSIS — N6311 Unspecified lump in the right breast, upper outer quadrant: Secondary | ICD-10-CM | POA: Diagnosis not present

## 2022-03-02 DIAGNOSIS — N6489 Other specified disorders of breast: Secondary | ICD-10-CM | POA: Diagnosis not present

## 2022-03-02 DIAGNOSIS — N6312 Unspecified lump in the right breast, upper inner quadrant: Secondary | ICD-10-CM | POA: Diagnosis not present

## 2022-03-09 ENCOUNTER — Ambulatory Visit (HOSPITAL_COMMUNITY): Admission: RE | Admit: 2022-03-09 | Payer: BC Managed Care – PPO | Source: Ambulatory Visit

## 2022-03-16 ENCOUNTER — Other Ambulatory Visit (HOSPITAL_COMMUNITY): Payer: Self-pay | Admitting: Internal Medicine

## 2022-03-16 ENCOUNTER — Ambulatory Visit (HOSPITAL_COMMUNITY)
Admission: RE | Admit: 2022-03-16 | Discharge: 2022-03-16 | Disposition: A | Discharge status: Home / Self Care | Payer: BC Managed Care – PPO | Source: Ambulatory Visit | Attending: Nurse Practitioner | Admitting: Nurse Practitioner

## 2022-03-16 ENCOUNTER — Ambulatory Visit (HOSPITAL_COMMUNITY)
Admission: RE | Admit: 2022-03-16 | Discharge: 2022-03-16 | Disposition: A | Discharge status: Home / Self Care | Payer: BC Managed Care – PPO | Source: Ambulatory Visit | Attending: Internal Medicine | Admitting: Internal Medicine

## 2022-03-16 DIAGNOSIS — N6312 Unspecified lump in the right breast, upper inner quadrant: Secondary | ICD-10-CM | POA: Diagnosis not present

## 2022-03-16 DIAGNOSIS — R921 Mammographic calcification found on diagnostic imaging of breast: Secondary | ICD-10-CM | POA: Diagnosis not present

## 2022-03-16 DIAGNOSIS — R928 Other abnormal and inconclusive findings on diagnostic imaging of breast: Secondary | ICD-10-CM | POA: Insufficient documentation

## 2022-03-16 DIAGNOSIS — N6011 Diffuse cystic mastopathy of right breast: Secondary | ICD-10-CM | POA: Diagnosis not present

## 2022-03-16 MED ORDER — LIDOCAINE HCL (PF) 2 % IJ SOLN
INTRAMUSCULAR | Status: DC
Start: 2022-03-16 — End: 2022-03-16
  Filled 2022-03-16: qty 10

## 2022-03-16 MED ORDER — LIDOCAINE-EPINEPHRINE (PF) 1 %-1:200000 IJ SOLN
INTRAMUSCULAR | Status: AC
  Filled 2022-03-16: qty 30

## 2022-03-17 LAB — SURGICAL PATHOLOGY

## 2022-03-30 DIAGNOSIS — R053 Chronic cough: Secondary | ICD-10-CM | POA: Diagnosis not present

## 2022-03-30 DIAGNOSIS — U071 COVID-19: Secondary | ICD-10-CM | POA: Diagnosis not present

## 2022-04-05 DIAGNOSIS — N904 Leukoplakia of vulva: Secondary | ICD-10-CM | POA: Diagnosis not present

## 2022-04-05 DIAGNOSIS — Z6820 Body mass index (BMI) 20.0-20.9, adult: Secondary | ICD-10-CM | POA: Diagnosis not present

## 2022-04-30 DIAGNOSIS — M546 Pain in thoracic spine: Secondary | ICD-10-CM | POA: Diagnosis not present

## 2022-05-09 DIAGNOSIS — F17210 Nicotine dependence, cigarettes, uncomplicated: Secondary | ICD-10-CM | POA: Diagnosis not present

## 2022-05-09 DIAGNOSIS — R079 Chest pain, unspecified: Secondary | ICD-10-CM | POA: Diagnosis not present

## 2022-05-09 DIAGNOSIS — X500XXA Overexertion from strenuous movement or load, initial encounter: Secondary | ICD-10-CM | POA: Diagnosis not present

## 2022-05-09 DIAGNOSIS — M546 Pain in thoracic spine: Secondary | ICD-10-CM | POA: Diagnosis not present

## 2022-05-09 DIAGNOSIS — S233XXA Sprain of ligaments of thoracic spine, initial encounter: Secondary | ICD-10-CM | POA: Diagnosis not present

## 2022-05-09 DIAGNOSIS — S239XXA Sprain of unspecified parts of thorax, initial encounter: Secondary | ICD-10-CM | POA: Diagnosis not present

## 2022-05-11 DIAGNOSIS — N904 Leukoplakia of vulva: Secondary | ICD-10-CM | POA: Diagnosis not present

## 2022-05-11 DIAGNOSIS — Z6821 Body mass index (BMI) 21.0-21.9, adult: Secondary | ICD-10-CM | POA: Diagnosis not present

## 2022-08-05 DIAGNOSIS — R053 Chronic cough: Secondary | ICD-10-CM | POA: Diagnosis not present

## 2022-08-12 DIAGNOSIS — R053 Chronic cough: Secondary | ICD-10-CM | POA: Diagnosis not present

## 2022-08-18 DIAGNOSIS — L089 Local infection of the skin and subcutaneous tissue, unspecified: Secondary | ICD-10-CM | POA: Diagnosis not present
